# Patient Record
Sex: Female | Born: 2000 | Hispanic: No | Marital: Single | Smoking: Never smoker
Health system: Southern US, Community
[De-identification: ages and names within clinical notes are randomized; demographics above are authoritative.]

## PROBLEM LIST (undated history)

## (undated) DIAGNOSIS — F419 Anxiety disorder, unspecified: Secondary | ICD-10-CM

## (undated) DIAGNOSIS — R609 Edema, unspecified: Secondary | ICD-10-CM

## (undated) DIAGNOSIS — K5909 Other constipation: Secondary | ICD-10-CM

## (undated) DIAGNOSIS — E119 Type 2 diabetes mellitus without complications: Secondary | ICD-10-CM

## (undated) DIAGNOSIS — R7303 Prediabetes: Secondary | ICD-10-CM

## (undated) HISTORY — DX: Anxiety disorder, unspecified: F41.9

## (undated) HISTORY — DX: Prediabetes: R73.03

## (undated) HISTORY — DX: Edema, unspecified: R60.9

## (undated) HISTORY — DX: Other constipation: K59.09

---
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
## 2021-01-12 ENCOUNTER — Other Ambulatory Visit: Payer: Self-pay | Admitting: Adult Health

## 2021-01-15 ENCOUNTER — Emergency Department (HOSPITAL_COMMUNITY)
Admission: EM | Admit: 2021-01-15 | Discharge: 2021-01-15 | Disposition: A | Discharge status: Home / Self Care | Payer: BC Managed Care – PPO | Attending: Emergency Medicine | Admitting: Emergency Medicine

## 2021-01-15 ENCOUNTER — Other Ambulatory Visit: Payer: Self-pay

## 2021-01-15 ENCOUNTER — Emergency Department (HOSPITAL_COMMUNITY): Payer: BC Managed Care – PPO

## 2021-01-15 DIAGNOSIS — N939 Abnormal uterine and vaginal bleeding, unspecified: Secondary | ICD-10-CM | POA: Insufficient documentation

## 2021-01-15 DIAGNOSIS — R109 Unspecified abdominal pain: Secondary | ICD-10-CM | POA: Diagnosis not present

## 2021-01-15 LAB — URINALYSIS, ROUTINE W REFLEX MICROSCOPIC
Bacteria, UA: NONE SEEN
Bilirubin Urine: NEGATIVE
Glucose, UA: NEGATIVE mg/dL
Ketones, ur: NEGATIVE mg/dL
Leukocytes,Ua: NEGATIVE
Nitrite: NEGATIVE
Protein, ur: 30 mg/dL — AB
RBC / HPF: >50 RBC/hpf — ABNORMAL HIGH (ref 0–5)
Specific Gravity, Urine: 1.029 (ref 1.005–1.030)
pH: 5 (ref 5.0–8.0)

## 2021-01-15 LAB — POC URINE PREG, ED: Preg Test, Ur: NEGATIVE

## 2021-01-15 MED ORDER — KETOROLAC TROMETHAMINE 30 MG/ML IJ SOLN
60.0000 mg | Freq: Once | INTRAMUSCULAR | Status: AC
  Administered 2021-01-15: 60 mg via INTRAMUSCULAR
  Filled 2021-01-15: qty 2

## 2021-01-15 NOTE — ED Provider Notes (Signed)
[facility] EMERGENCY DEPARTMENT Provider Note   CSN: [id number] Arrival date & time: 01/15/21  0301     History Chief Complaint  Patient presents with  . Flank Pain    Angie Jackson is a 20 y.o. female.  The history is provided by the patient.  Flank Pain This is a new problem. The current episode started 2 days ago. The problem occurs constantly. The problem has been gradually worsening. Pertinent negatives include no chest pain and no abdominal pain. Nothing aggravates the symptoms. Nothing relieves the symptoms.  Patient presents for 2 complaints.  Patient reports she has been having right-sided flank pain for the past 2 days.  No trauma.  It does not wrap around into her abdomen.  No chest pain or shortness of breath.  No dysuria or urinary frequency  Patient also reports vaginal bleeding.  She reports her menstrual cycle completed last week.  Then this bleeding started over the past day.  She reports initially it was spotting, now she is having heavier flow.  She has only used 1 pad since that time. She does not take any contraceptives.  She is not on anticoagulation      PMH-none Patient Active Problem List   Diagnosis Date Noted  . 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   OB History    Gravida  0   Para  0   Term  0   Preterm  0   AB  0   Living  0     SAB  0   IAB  0   Ectopic  0   Multiple  0   Live Births  0           No family history on file.  Social History   Tobacco Use  . Smoking status: Never Smoker  . Smokeless tobacco: Never Used  Vaping Use  . Vaping Use: Never used  Substance Use Topics  . Alcohol use: Never  . Drug use: Never    Home Medications Prior to Admission medications   Medication Sig Start Date End Date Taking? Authorizing Provider  medroxyPROGESTERone Acetate 150 MG/ML SUSY Dispense 150mg  to be administered at office 10/09/20 01/15/21  01/17/21, NP    Allergies     Patient has no known allergies.  Review of Systems   Review of Systems  Constitutional: Negative for fever.  Cardiovascular: Negative for chest pain.  Gastrointestinal: Negative for abdominal pain and vomiting.  Genitourinary: Positive for flank pain and vaginal bleeding. Negative for dysuria and frequency.  All other systems reviewed and are negative.   Physical Exam Updated Vital Signs BP 101/78 (BP Location: Left Arm)   Pulse 63   Temp 98.4 F (36.9 C)   Resp 17   Ht 1.549 m (5\' 1" )   Wt 54.4 kg   LMP  (Within Weeks)   SpO2 98%   BMI 22.67 kg/m   Physical Exam  CONSTITUTIONAL: Well developed/well nourished, mildly uncomfortable appearing HEAD: Normocephalic/atraumatic EYES: EOMI/PERRL ENMT: Mucous membranes moist NECK: supple no meningeal signs SPINE/BACK:entire spine nontender, no bruising/crepitance/stepoffs noted to spine CV: S1/S2 noted, no murmurs/rubs/gallops noted LUNGS: Lungs are clear to auscultation bilaterally, no apparent distress ABDOMEN: soft, nontender, no rebound or guarding, bowel sounds noted throughout abdomen GU: Right cva tenderness, no rash or erythema to flank NEURO: Pt is awake/alert/appropriate, moves all extremitiesx4.  No facial droop.   EXTREMITIES: pulses normal/equal, full ROM SKIN: warm, color normal  PSYCH: no abnormalities of mood noted, alert and oriented to situation  ED Results / Procedures / Treatments   Labs (all labs ordered are listed, but only abnormal results are displayed) Labs Reviewed  URINALYSIS, ROUTINE W REFLEX MICROSCOPIC - Abnormal; Notable for the following components:      Result Value   APPearance CLOUDY (*)    Hgb urine dipstick LARGE (*)    Protein, ur 30 (*)    RBC / HPF >50 (*)    All other components within normal limits  URINE CULTURE  POC URINE PREG, ED    EKG None  Radiology CT Renal Stone Study  Result Date: 01/15/2021 CLINICAL DATA:  Right flank pain, vaginal bleeding EXAM: CT ABDOMEN AND  PELVIS WITHOUT CONTRAST TECHNIQUE: Multidetector CT imaging of the abdomen and pelvis was performed following the standard protocol without IV contrast. COMPARISON:  02/23/2020 FINDINGS: Lower chest: No acute abnormality. Hepatobiliary: No focal liver abnormality is seen. No gallstones, gallbladder wall thickening, or biliary dilatation. Pancreas: Unremarkable Spleen: Unremarkable Adrenals/Urinary Tract: Adrenal glands are unremarkable. Kidneys are normal, without renal calculi, focal lesion, or hydronephrosis. Bladder is unremarkable. Stomach/Bowel: Stomach is within normal limits. Appendix appears normal. No evidence of bowel wall thickening, distention, or inflammatory changes. No free intraperitoneal gas or fluid. Vascular/Lymphatic: No significant vascular findings are present. No enlarged abdominal or pelvic lymph nodes. Reproductive: Uterus and bilateral adnexa are unremarkable. Other: No abdominal wall hernia.  Rectum unremarkable. Musculoskeletal: No acute bone abnormality. No lytic or blastic bone lesion. IMPRESSION: No acute intra-abdominal pathology identified. No radiographic explanation for the patient's reported symptoms. Electronically Signed   By: Helyn Numbers MD   On: 01/15/2021 05:33    Procedures Procedures   Medications Ordered in ED Medications  ketorolac (TORADOL) 30 MG/ML injection 60 mg (60 mg Intramuscular Given 01/15/21 0518)    ED Course  I have reviewed the triage vital signs and the nursing notes.  Pertinent labs & imaging results that were available during my care of the patient were reviewed by me and considered in my medical decision making (see chart for details).    MDM Rules/Calculators/A&P                          Patient presented for concerns of vaginal bleeding and flank pain.  Due to the bleeding, it is unclear if the hematuria is from a kidney stone versus contaminant.  She denied any urinary symptoms to suggest UTI (we will send urine culture) Patient  appeared uncomfortable with her flank pain.  After further discussion, we agreed to proceed with CT imaging to rule out ureteral stone. CT renal study shows no acute findings Patient is otherwise well-appearing.  Will defer pelvic exam at this time.  Will refer to gynecology  Final Clinical Impression(s) / ED Diagnoses Final diagnoses:  Flank pain  Vaginal bleeding    Rx / DC Orders ED Discharge Orders    None       Zadie Rhine, MD 01/15/21 [phone]

## 2021-01-15 NOTE — ED Notes (Signed)
Patient transported to CT 

## 2021-01-15 NOTE — ED Triage Notes (Signed)
Pt c/o of flank pain and vaginal bleeding. Pt states she had her period last week. Pt states her cycle is irregular.

## 2021-01-17 LAB — URINE CULTURE: Culture: >=100000 — AB

## 2021-01-18 ENCOUNTER — Telehealth: Payer: Self-pay | Admitting: Emergency Medicine

## 2021-01-18 DIAGNOSIS — L700 Acne vulgaris: Secondary | ICD-10-CM | POA: Diagnosis not present

## 2021-01-18 NOTE — Telephone Encounter (Signed)
Post ED Visit - Positive Culture Follow-up  Culture report reviewed by antimicrobial stewardship pharmacist: [facility] []  , Pharm.D. []  Enzo Bi, Pharm.D., BCPS AQ-ID []  , Pharm.D., BCPS []  Celedonio Miyamoto, Pharm.D., BCPS []  [place], Garvin Fila.D., BCPS, AAHIVP []  , Pharm.D., BCPS, AAHIVP []  Georgina Pillion, PharmD, BCPS []  , PharmD, BCPS []  [place], PharmD, BCPS []  [address], PharmD []  , PharmD, BCPS []  Estella Husk, PharmD  Pharmacy Team []  Lysle Pearl, PharmD []  , PharmD []  Phillips Climes, PharmD []  , Rph []  Agapito Games) , PharmD []  Verlan Friends, PharmD []  , PharmD []  Mervyn Gay, PharmD []  , PharmD []  Vinnie Level, PharmD []  [facility], PharmD []  , PharmD []  Len Childs, PharmD   Positive urine culture Treated with none, likely contamination and no further patient follow-up is required at this time.  01/18/2021, 10:10 AM

## 2021-09-06 ENCOUNTER — Ambulatory Visit: Payer: BC Managed Care – PPO | Admitting: Women's Health

## 2021-10-30 IMAGING — CT CT RENAL STONE PROTOCOL
2 of 4 series · 16 of 46 positions shown, 18 images · non-contrast
Comparison: 02/23/2020

CLINICAL DATA: Right flank pain, vaginal bleeding

EXAM:
CT ABDOMEN AND PELVIS WITHOUT CONTRAST
TECHNIQUE: Multidetector CT imaging of the abdomen and pelvis was performed
following the standard protocol without IV contrast.

[Series 2: axial st · axial · 0.53mm/px · z∈[+888,+1228]mm · 13 of 78 slices shown, 15 images]
[im 5/78  soft-tissue]
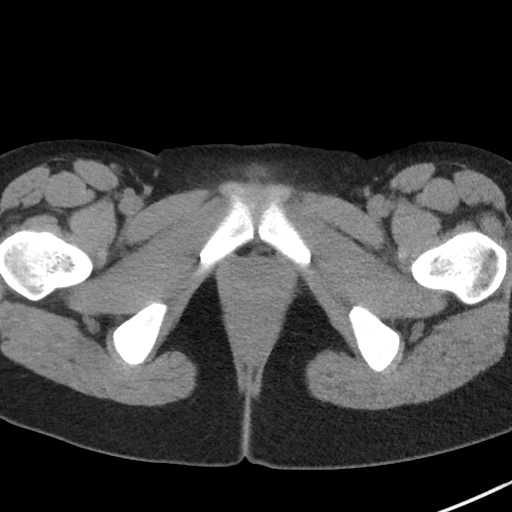
[im 5/78  bone]
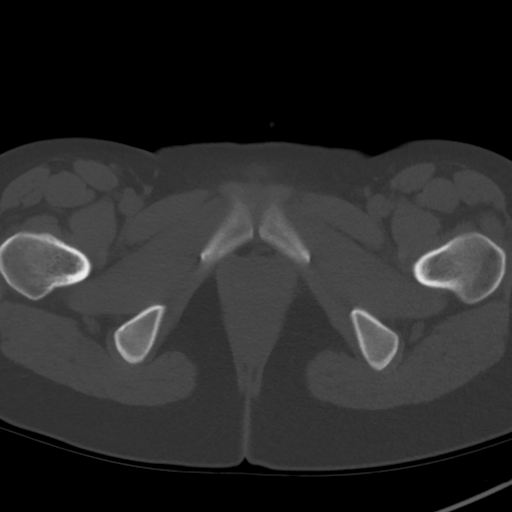
[im 10/78  soft-tissue]
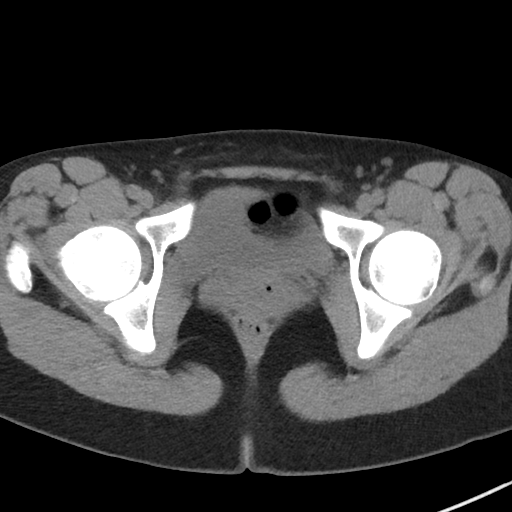
[im 15/78  soft-tissue]
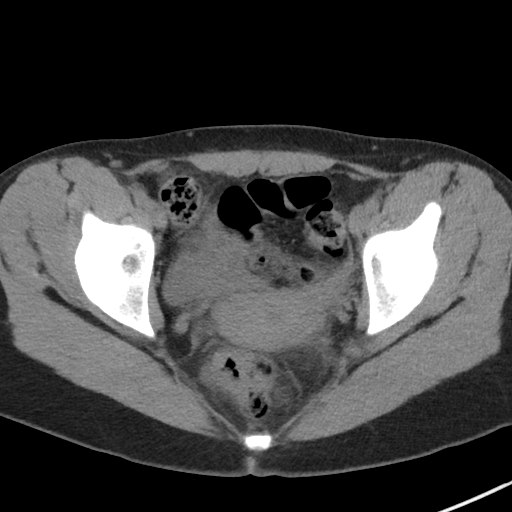
[im 25/78  soft-tissue]
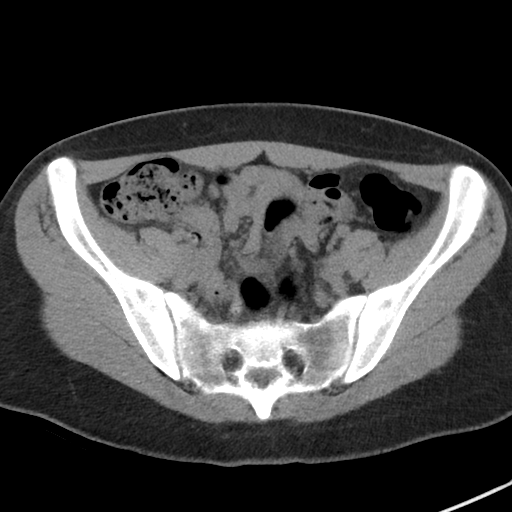
[im 29/78  soft-tissue]
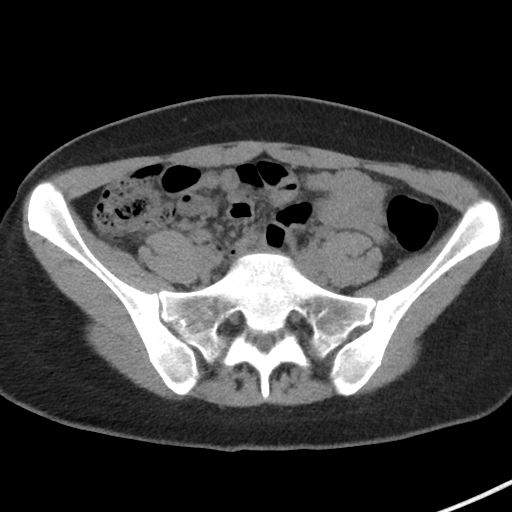
[im 34/78  soft-tissue]
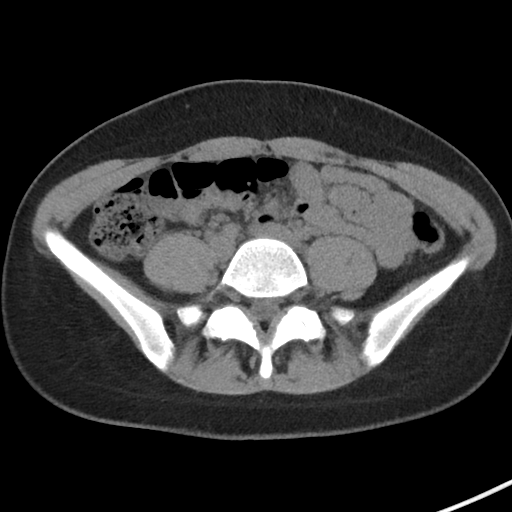
[im 39/78  soft-tissue]
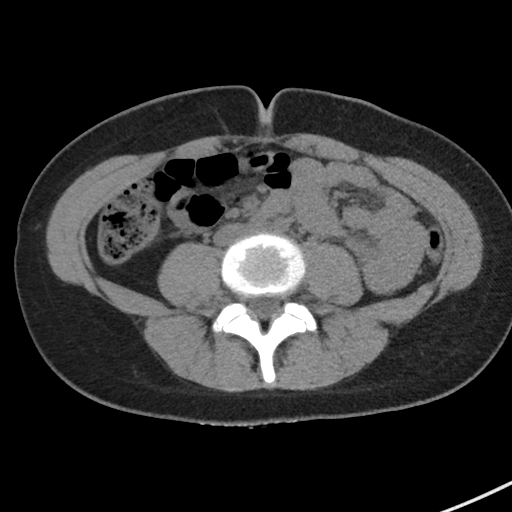
[im 44/78  soft-tissue]
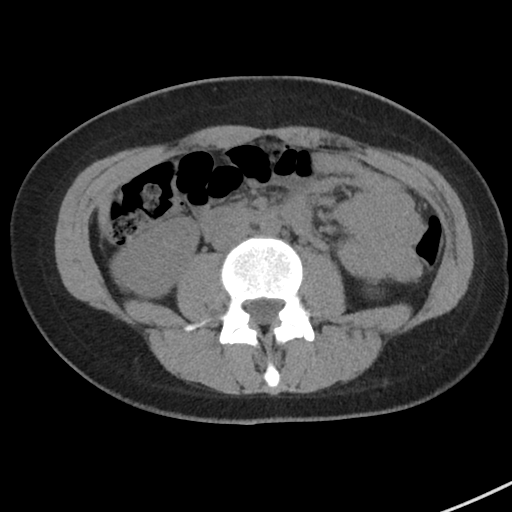
[im 49/78  soft-tissue]
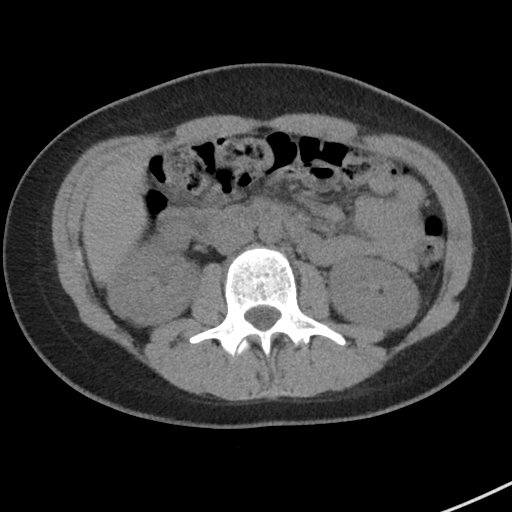
[im 49/78  bone]
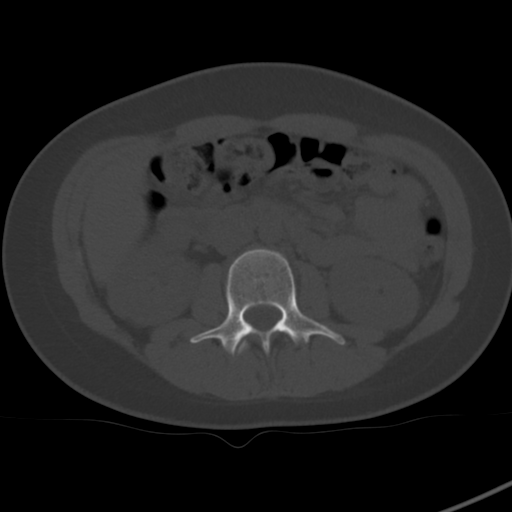
[im 53/78  soft-tissue]
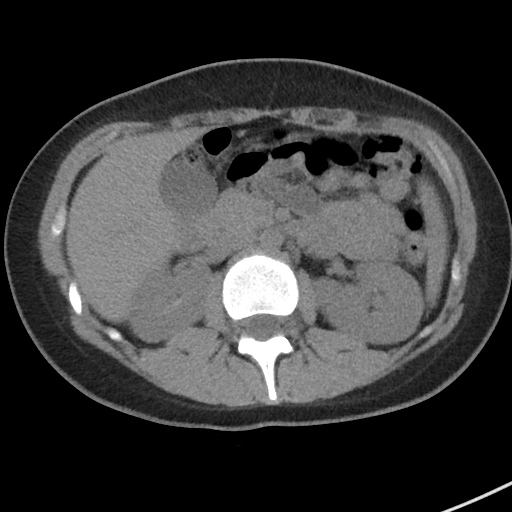
[im 63/78  soft-tissue]
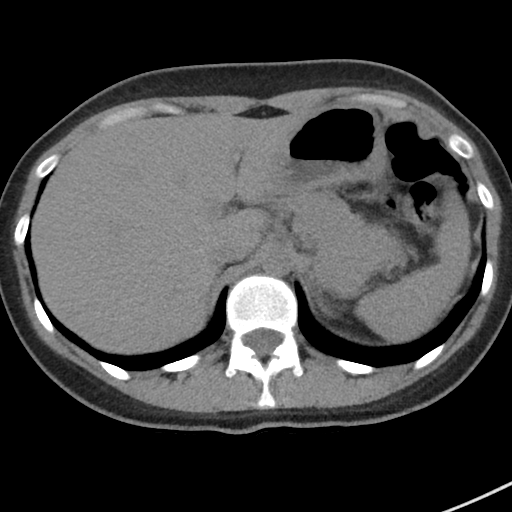
[im 68/78  soft-tissue]
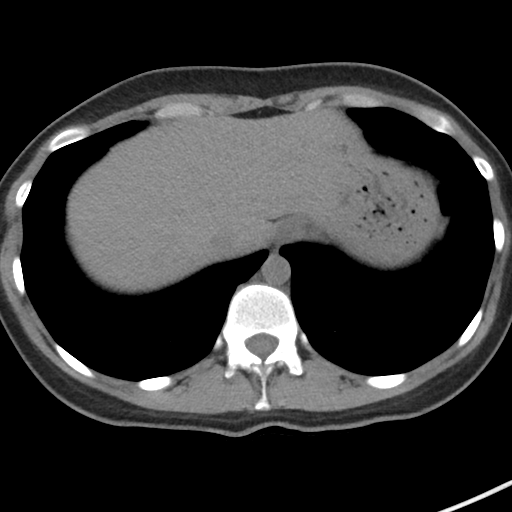
[im 73/78  soft-tissue]
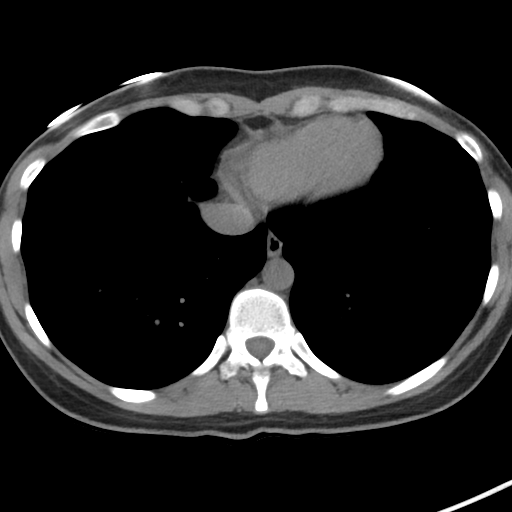

[Series 5: coronal st · coronal · 0.62mm/px · 3 of 60 slices shown]
[im 20/60  soft-tissue]
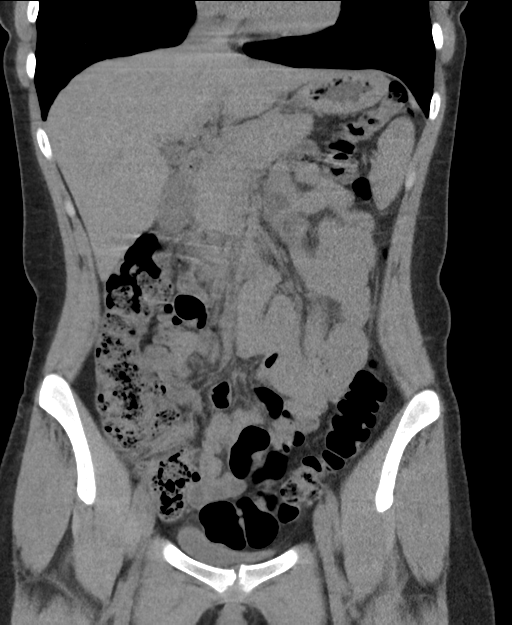
[im 27/60  soft-tissue]
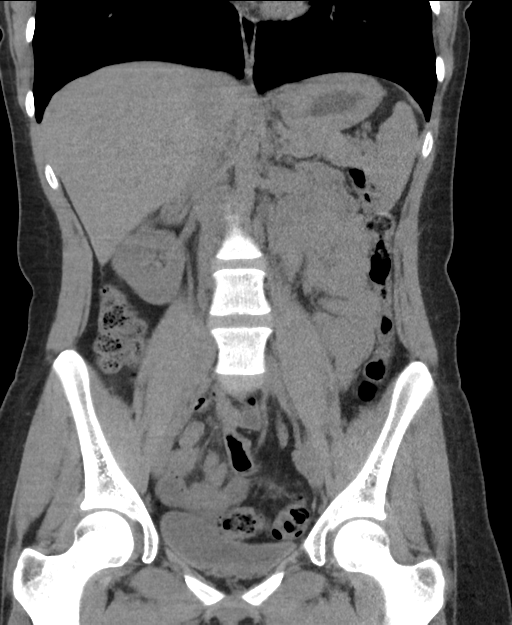
[im 33/60  soft-tissue]
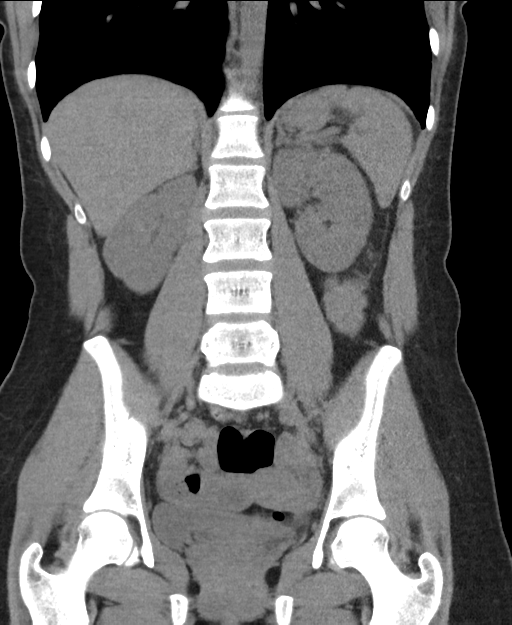

[16 of 46 positions shown; findings below may reference images not displayed]

FINDINGS: Lower chest: No acute abnormality.

Hepatobiliary: No focal liver abnormality is seen. No gallstones,
gallbladder wall thickening, or biliary dilatation.

Pancreas: Unremarkable

Spleen: Unremarkable

Adrenals/Urinary Tract: Adrenal glands are unremarkable. Kidneys are
normal, without renal calculi, focal lesion, or hydronephrosis.
Bladder is unremarkable.

Stomach/Bowel: Stomach is within normal limits. Appendix appears
normal. No evidence of bowel wall thickening, distention, or
inflammatory changes. No free intraperitoneal gas or fluid.

Vascular/Lymphatic: No significant vascular findings are present. No
enlarged abdominal or pelvic lymph nodes.

Reproductive: Uterus and bilateral adnexa are unremarkable.

Other: No abdominal wall hernia.  Rectum unremarkable.

Musculoskeletal: No acute bone abnormality. No lytic or blastic bone
lesion.
IMPRESSION: No acute intra-abdominal pathology identified. No radiographic
explanation for the patient's reported symptoms.

## 2022-01-10 ENCOUNTER — Encounter: Payer: Self-pay | Admitting: Women's Health

## 2022-01-10 ENCOUNTER — Ambulatory Visit (INDEPENDENT_AMBULATORY_CARE_PROVIDER_SITE_OTHER): Payer: BC Managed Care – PPO | Admitting: Women's Health

## 2022-01-10 ENCOUNTER — Other Ambulatory Visit (HOSPITAL_COMMUNITY)
Admission: RE | Admit: 2022-01-10 | Discharge: 2022-01-10 | Disposition: A | Discharge status: Home / Self Care | Payer: BC Managed Care – PPO | Source: Ambulatory Visit | Attending: Women's Health | Admitting: Women's Health

## 2022-01-10 VITALS — BP 112/77 | HR 75 | Ht 61.0 in | Wt 122.0 lb

## 2022-01-10 DIAGNOSIS — N926 Irregular menstruation, unspecified: Secondary | ICD-10-CM

## 2022-01-10 DIAGNOSIS — Z113 Encounter for screening for infections with a predominantly sexual mode of transmission: Secondary | ICD-10-CM | POA: Insufficient documentation

## 2022-01-10 DIAGNOSIS — Z3202 Encounter for pregnancy test, result negative: Secondary | ICD-10-CM | POA: Diagnosis not present

## 2022-01-10 DIAGNOSIS — R102 Pelvic and perineal pain: Secondary | ICD-10-CM | POA: Insufficient documentation

## 2022-01-10 DIAGNOSIS — N941 Unspecified dyspareunia: Secondary | ICD-10-CM | POA: Diagnosis not present

## 2022-01-10 DIAGNOSIS — Z789 Other specified health status: Secondary | ICD-10-CM

## 2022-01-10 LAB — POCT URINE PREGNANCY: Preg Test, Ur: NEGATIVE

## 2022-01-10 MED ORDER — NEXTSTELLIS 3-14.2 MG PO TABS: 1.0000 | ORAL_TABLET | Freq: Every day | ORAL | 3 refills | Status: DC

## 2022-01-10 NOTE — Progress Notes (Signed)
? ?GYN VISIT ?Patient name: Angie Jackson MRN [id number]  Date of birth: 12-Sep-2000 ?Chief Complaint:   ?Pelvic Pain (Mostly right side but feels it on both sides. ) ? ?History of Present Illness:   ?FALISA LAMORA is a 21 y.o. G0P0000 Hispanic female being seen today for sharp pelvic pain x 1-74yrs, worse for last , Rt >Lt, hurts w/ and w/o sex. Irregular periods,  q 1-28mths, last 5d, heavy to begin with- changes pad q 2-3hrs, bad cramps.  Primary amenorrhea at 21yo, had to start period w/ provera, then started on sprintec. Pt stopped b/c periods were too heavy. Switched to depo, had very bad acne w/ depo, only got 1 dose. Has not been on anything since. Has been using condoms. Does not want a pregnancy right now. Denies hirsutism, hair loss, acne, acanthosis nigricans.  ?No LMP recorded. ?The current method of family planning is condoms.  ?Last pap <21yo. Results were: N/A ? ? ?  04/04/2018  ?  3:45 PM  ?Depression screen PHQ 2/9  ?Decreased Interest 0  ?Down, Depressed, Hopeless 0  ?PHQ - 2 Score 0  ? ?  ?   ? View : No data to display.  ?  ?  ?  ? ? ? ?Review of Systems:   ?Pertinent items are noted in HPI ?Denies fever/chills, dizziness, headaches, visual disturbances, fatigue, shortness of breath, chest pain, abdominal pain, vomiting, abnormal vaginal discharge/itching/odor/irritation, problems with periods, bowel movements, urination, or intercourse unless otherwise stated above.  ?Pertinent History Reviewed:  ?Reviewed past medical,surgical, social, obstetrical and family history.  ?Reviewed problem list, medications and allergies. ?Physical Assessment:  ? ?Vitals:  ? 01/10/22 1549  ?BP: 112/77  ?Pulse: 75  ?Weight: 122 lb (55.3 kg)  ?Height: 5\' 1"  (1.549 m)  ?Body mass index is 23.05 kg/m?. ? ?     Physical Examination:  ? General appearance: alert, well appearing, and in no distress ? Mental status: alert, oriented to person, place, and time ? Skin: warm & dry  ? Cardiovascular: normal heart rate  noted ? Respiratory: normal respiratory effort, no distress ? Abdomen: soft, non-tender  ? Pelvic: VULVA: normal appearing vulva with no masses, tenderness or lesions, VAGINA: normal appearing vagina with normal color and discharge, no lesions, CERVIX: normal appearing cervix without discharge or lesions, UTERUS: uterus is normal size, shape, consistency and slightly tender, ADNEXA: normal adnexa in size, bilateral tenderness, Rt >Lt, no masses ? Extremities: no edema  ? ?Chaperone:   ? ?Results for orders placed or performed in visit on 01/10/22 (from the past 24 hour(s))  ?POCT urine pregnancy  ? Collection Time: 01/10/22  3:58 PM  ?Result Value Ref Range  ? Preg Test, Ur Negative Negative  ?  ?Assessment & Plan:  ?1) Pelvic pain, dyspareunia, irregular periods> CV swab, will get pelvic u/s, start on Nexstellis (rx to [facility] and gave 3 sample packs) ? ?Meds:  ?Meds ordered this encounter  ?Medications  ? Drospirenone-Estetrol (NEXTSTELLIS) 3-14.2 MG TABS  ?  Sig: Take 1 tablet by mouth daily.  ?  Dispense:  90 tablet  ?  Refill:  3  ?  Order Specific Question:   Supervising Provider  ?  Answer:   01/12/22 H [[id number]]  ? ? ?Orders Placed This Encounter  ?Procedures  ? [facility] PELVIS (TRANSABDOMINAL ONLY)  ? US PELVIS TRANSVAGINAL NON-OB (TV ONLY)  ? POCT urine pregnancy  ? ? ?Return for 1st available, US:GYN and f/u after. ? ?[place]  Cedric Fishman, WHNP-BC ?01/10/2022 ?4:26 PM  ?

## 2022-01-12 LAB — CERVICOVAGINAL ANCILLARY ONLY
Bacterial Vaginitis (gardnerella): POSITIVE — AB
Candida Glabrata: NEGATIVE
Candida Vaginitis: NEGATIVE
Chlamydia: NEGATIVE
Comment: NEGATIVE
Comment: NEGATIVE
Comment: NEGATIVE
Comment: NEGATIVE
Comment: NEGATIVE
Comment: NORMAL
Neisseria Gonorrhea: NEGATIVE
Trichomonas: NEGATIVE

## 2022-01-14 MED ORDER — METRONIDAZOLE 500 MG PO TABS: 500.0000 mg | ORAL_TABLET | Freq: Two times a day (BID) | ORAL | 0 refills | Status: DC

## 2022-01-14 NOTE — Addendum Note (Signed)
Addended by: Shawna Clamp R on: 01/14/2022 11:53 PM   Modules accepted: Orders

## 2022-02-22 ENCOUNTER — Ambulatory Visit (INDEPENDENT_AMBULATORY_CARE_PROVIDER_SITE_OTHER): Payer: BC Managed Care – PPO | Admitting: Women's Health

## 2022-02-22 ENCOUNTER — Ambulatory Visit (INDEPENDENT_AMBULATORY_CARE_PROVIDER_SITE_OTHER): Payer: BC Managed Care – PPO

## 2022-02-22 ENCOUNTER — Encounter: Payer: Self-pay | Admitting: Women's Health

## 2022-02-22 VITALS — BP 109/72 | HR 75 | Ht 60.0 in | Wt 121.2 lb

## 2022-02-22 DIAGNOSIS — N9489 Other specified conditions associated with female genital organs and menstrual cycle: Secondary | ICD-10-CM | POA: Diagnosis not present

## 2022-02-22 DIAGNOSIS — E282 Polycystic ovarian syndrome: Secondary | ICD-10-CM | POA: Diagnosis not present

## 2022-02-22 DIAGNOSIS — N926 Irregular menstruation, unspecified: Secondary | ICD-10-CM | POA: Diagnosis not present

## 2022-02-22 DIAGNOSIS — R102 Pelvic and perineal pain: Secondary | ICD-10-CM | POA: Diagnosis not present

## 2022-02-22 DIAGNOSIS — N941 Unspecified dyspareunia: Secondary | ICD-10-CM

## 2022-02-22 NOTE — Progress Notes (Signed)
GYN VISIT Patient name: Angie Jackson MRN [id number]  Date of birth: 02/08/2001 Chief Complaint:   GYN Ultrasound and Follow-up (Pelvic pain)  History of Present Illness:   Angie Jackson is a 21 y.o. G0P0000 Hispanic female being seen today for f/u on nextstellis rx'd on 5/15 for pelvic pain and irregular periods q 1-32mths. Doing well on COCs, did notice swelling in Rt leg one day at work last week, has since resolved, no pain/redness. Burning at introitus x 2wks. +Bv on CV swab 5/15, took metronidazole. Uses [employer], but always has. Denies abnormal discharge, itching/odor/irritation.   Patient's last menstrual period was 02/10/2022. The current method of family planning is OCP (estrogen/progesterone).  Last pap <21yo. Results were: N/A     04/04/2018    3:45 PM  Depression screen PHQ 2/9  Decreased Interest 0  Down, Depressed, Hopeless 0  PHQ - 2 Score 0         No data to display           Review of Systems:   Pertinent items are noted in HPI Denies fever/chills, dizziness, headaches, visual disturbances, fatigue, shortness of breath, chest pain, abdominal pain, vomiting, abnormal vaginal discharge/itching/odor/irritation, problems with periods, bowel movements, urination, or intercourse unless otherwise stated above.  Pertinent History Reviewed:  Reviewed past medical,surgical, social, obstetrical and family history.  Reviewed problem list, medications and allergies. Physical Assessment:   Vitals:   02/22/22 1542  BP: 109/72  Pulse: 75  Weight: 121 lb 3.2 oz (55 kg)  Height: 5' (1.524 m)  Body mass index is 23.67 kg/m.       Physical Examination:   General appearance: alert, well appearing, and in no distress  Mental status: alert, oriented to person, place, and time  Skin: warm & dry   Cardiovascular: normal heart rate noted  Respiratory: normal respiratory effort, no distress  Abdomen: soft, non-tender   Pelvic: normal external genitalia, no  lesions, erythema, abnormal d/c  Extremities: no edema   Chaperone: Jobe Marker    Today's pelvic u/s:  Angie Jackson is a 21 y.o. G0P0000 LMP 02/10/2022 She is here for a pelvic sonogram for pelvic pain,dyspareunia and irregular periods.   Uterus                      4.2 x 3.7 x 2.3 cm, Total uterine volume 20 cc, homogeneous anteflexed uterus,WNL   Endometrium          8 mm, symmetrical, wnl   Right ovary             4.1 x 3.1 x 2.6 cm, vol 17.6 cc,enlarged ovary with multiple peripheral follicles    Left ovary                2 x 3.6 x 2.4 cm, vol 18.7 cc,enlarged ovary with multiple peripheral follicles    No free fluid    Technician Comments:   PELVIC US TA/TV: homogeneous anteflexed uterus,WNL,EEC 8 mm,multiple small simple nabothian cysts,bilaterally enlarged ovaries with multiple small peripheral follicles    Chaperone [address] Flora Lipps 02/22/2022 3:47 PM      No results found for this or any previous visit (from the past 24 hour(s)).  Assessment & Plan:  1) PCOS> dx by u/s and oligomenorrhea, no hyperadrogenic sx, doing well on nextstellis- continue, f/u from start date  2) Burning at vaginal introitus> normal exam, CV swab not done, was +BV  on 5/15, finished tx. Try changing soaps/use no soap, let us know if worsening/not improving  Meds: No orders of the defined types were placed in this encounter.   No orders of the defined types were placed in this encounter.   Return in about 7 weeks (around 04/12/2022) for med f/u, in person.  Cheral Marker CNM, [facility] 02/22/2022 4:24 PM

## 2022-03-07 ENCOUNTER — Encounter: Payer: Self-pay | Admitting: Women's Health

## 2022-03-07 ENCOUNTER — Telehealth: Payer: Self-pay

## 2022-03-07 ENCOUNTER — Other Ambulatory Visit: Payer: Self-pay | Admitting: Women's Health

## 2022-03-07 DIAGNOSIS — M7989 Other specified soft tissue disorders: Secondary | ICD-10-CM

## 2022-03-07 DIAGNOSIS — M79604 Pain in right leg: Secondary | ICD-10-CM

## 2022-03-07 NOTE — Telephone Encounter (Signed)
Pt would like for Joellyn Haff to give her a call, she has a few questions to ask her.  She goes on break again at 11:30.

## 2022-03-07 NOTE — Telephone Encounter (Signed)
Responded to patient via mychart

## 2022-03-08 ENCOUNTER — Ambulatory Visit (HOSPITAL_COMMUNITY)
Admission: RE | Admit: 2022-03-08 | Discharge: 2022-03-08 | Disposition: A | Discharge status: Home / Self Care | Payer: BC Managed Care – PPO | Source: Ambulatory Visit | Attending: Women's Health | Admitting: Women's Health

## 2022-03-08 ENCOUNTER — Encounter: Payer: Self-pay | Admitting: Women's Health

## 2022-03-08 DIAGNOSIS — M79604 Pain in right leg: Secondary | ICD-10-CM | POA: Diagnosis not present

## 2022-03-08 DIAGNOSIS — M7989 Other specified soft tissue disorders: Secondary | ICD-10-CM | POA: Insufficient documentation

## 2022-03-08 DIAGNOSIS — M79661 Pain in right lower leg: Secondary | ICD-10-CM | POA: Diagnosis not present

## 2022-03-21 ENCOUNTER — Encounter: Payer: Self-pay | Admitting: Women's Health

## 2022-03-22 ENCOUNTER — Other Ambulatory Visit: Payer: Self-pay | Admitting: Women's Health

## 2022-03-22 MED ORDER — LO LOESTRIN FE 1 MG-10 MCG / 10 MCG PO TABS: 1.0000 | ORAL_TABLET | Freq: Every day | ORAL | 3 refills | Status: DC

## 2022-04-12 ENCOUNTER — Ambulatory Visit (INDEPENDENT_AMBULATORY_CARE_PROVIDER_SITE_OTHER): Payer: BC Managed Care – PPO | Admitting: Women's Health

## 2022-04-12 ENCOUNTER — Encounter: Payer: Self-pay | Admitting: Women's Health

## 2022-04-12 VITALS — BP 109/74 | HR 80 | Ht 60.0 in | Wt 125.0 lb

## 2022-04-12 DIAGNOSIS — E282 Polycystic ovarian syndrome: Secondary | ICD-10-CM | POA: Diagnosis not present

## 2022-04-12 NOTE — Progress Notes (Signed)
   GYN VISIT Patient name: Angie Jackson MRN [id number]  Date of birth: [birth year] Chief Complaint:   Follow-up  History of Present Illness:   Angie Jackson is a 21 y.o. G0P0000 Hispanic female being seen today for f/u on COCs rx'd 5/15 for PCOS. Initially rx'd nextstellis, was causing Rt lower leg edema, had neg DVT u/s. Switched to LoLo, no swelling since, just started period yesterday. No problems.     Patient's last menstrual period was 04/11/2022. The current method of family planning is OCP (estrogen/progesterone).  Last pap <21yo. Results were: N/A     04/04/2018    3:45 PM  Depression screen PHQ 2/9  Decreased Interest 0  Down, Depressed, Hopeless 0  PHQ - 2 Score 0         No data to display           Review of Systems:   Pertinent items are noted in HPI Denies fever/chills, dizziness, headaches, visual disturbances, fatigue, shortness of breath, chest pain, abdominal pain, vomiting, abnormal vaginal discharge/itching/odor/irritation, problems with periods, bowel movements, urination, or intercourse unless otherwise stated above.  Pertinent History Reviewed:  Reviewed past medical,surgical, social, obstetrical and family history.  Reviewed problem list, medications and allergies. Physical Assessment:   Vitals:   04/12/22 1611  BP: 109/74  Pulse: 80  Weight: 125 lb (56.7 kg)  Height: 5' (1.524 m)  Body mass index is 24.41 kg/m.       Physical Examination:   General appearance: alert, well appearing, and in no distress  Mental status: alert, oriented to person, place, and time  Skin: warm & dry   Cardiovascular: normal heart rate noted  Respiratory: normal respiratory effort, no distress  Abdomen: soft, non-tender   Pelvic: examination not indicated  Extremities: no edema   Chaperone: N/A    No results found for this or any previous visit (from the past 24 hour(s)).  Assessment & Plan:  1) PCOS> doing well on LoLoestrin, f/u after 6/2 for pap  Meds:  No orders of the defined types were placed in this encounter.   No orders of the defined types were placed in this encounter.   Return for 01/29/23 for pap & physical.  Cheral Marker CNM, [facility] 04/12/2022 4:35 PM

## 2022-04-28 ENCOUNTER — Ambulatory Visit
Admission: EM | Admit: 2022-04-28 | Discharge: 2022-04-28 | Discharge status: Against Medical Advice | Payer: BC Managed Care – PPO

## 2022-04-28 ENCOUNTER — Encounter (HOSPITAL_COMMUNITY): Payer: Self-pay

## 2022-04-28 ENCOUNTER — Emergency Department (HOSPITAL_COMMUNITY)
Admission: EM | Admit: 2022-04-28 | Discharge: 2022-04-28 | Disposition: A | Discharge status: Home / Self Care | Payer: BC Managed Care – PPO | Attending: Emergency Medicine | Admitting: Emergency Medicine

## 2022-04-28 ENCOUNTER — Other Ambulatory Visit: Payer: Self-pay

## 2022-04-28 DIAGNOSIS — U071 COVID-19: Secondary | ICD-10-CM | POA: Diagnosis not present

## 2022-04-28 DIAGNOSIS — J111 Influenza due to unidentified influenza virus with other respiratory manifestations: Secondary | ICD-10-CM | POA: Diagnosis not present

## 2022-04-28 DIAGNOSIS — R6889 Other general symptoms and signs: Secondary | ICD-10-CM

## 2022-04-28 DIAGNOSIS — M791 Myalgia, unspecified site: Secondary | ICD-10-CM | POA: Diagnosis not present

## 2022-04-28 LAB — RESP PANEL BY RT-PCR (FLU A&B, COVID) ARPGX2
Influenza A by PCR: NEGATIVE
Influenza B by PCR: NEGATIVE
SARS Coronavirus 2 by RT PCR: POSITIVE — AB

## 2022-04-28 NOTE — ED Triage Notes (Signed)
Pt presents to ED with complaints of generalized body aches, headache and cough started today

## 2022-04-28 NOTE — ED Notes (Signed)
Pt not found in waiting room.

## 2022-04-28 NOTE — Discharge Instructions (Addendum)
Please use Tylenol or ibuprofen for fever, bodyaches.  You may use 600 mg ibuprofen every 6 hours or 1000 mg of Tylenol every 6 hours.  You may choose to alternate between the 2.  This would be most effective.  Not to exceed 4 g of Tylenol within 24 hours.  Not to exceed 3200 mg ibuprofen 24 hours.  I recommend plenty of rest, plenty of fluids, over-the-counter cough and cold medication as needed.  You can check the results of your respiratory swab on your patient portal, otherwise I would treat this as another viral upper respiratory infection.  If you do have COVID, I recommend 5 days of strict quarantine, 5 days of strict masking thereafter, and monitoring for any worsening breathing.  If you have the flu or another unspecified viral illness then the treatment is just symptomatic care and time.  Refrain from going to work while you have an active fever.

## 2022-04-28 NOTE — ED Notes (Addendum)
Pt contact information called and no answer.

## 2022-04-28 NOTE — ED Provider Notes (Signed)
[facility] EMERGENCY DEPARTMENT Provider Note   CSN: [id number] Arrival date & time: 04/28/22  1717     History  Chief Complaint  Patient presents with   Generalized Body Aches    Angie Jackson is a 21 y.o. female with noncontributory past medical history who presents with generalized body aches, headache, cough that started today.  Patient denies any chest pain, shortness of breath.  She has not tried anything for the pain at this time.  Patient is wondering whether she could have COVID or the flu or something like that.  She has no history of asthma.  She denies feeling fever or chills but reports that she had mild temperature on arrival, on arrival 99.4 Fahrenheit.  Denies dysuria, diarrhea, loss of taste or smell.  HPI     Home Medications Prior to Admission medications   Medication Sig Start Date End Date Taking? Authorizing Provider  LO LOESTRIN FE 1 MG-10 MCG / 10 MCG tablet Take 1 tablet by mouth daily. 03/22/22   Cheral Marker, CNM  medroxyPROGESTERone Acetate 150 MG/ML SUSY Dispense 150mg  to be administered at office 10/09/20 01/15/21  01/17/21, NP      Allergies    Patient has no known allergies.    Review of Systems   Review of Systems  Respiratory:  Positive for cough.   Musculoskeletal:  Positive for myalgias.  All other systems reviewed and are negative.   Physical Exam Updated Vital Signs BP 124/86 (BP Location: Right Arm)   Pulse 89   Temp 99.4 F (37.4 C) (Oral)   Resp 20   Ht 5' (1.524 m)   Wt 55.3 kg   LMP 04/11/2022   SpO2 100%   BMI 23.83 kg/m  Physical Exam Vitals and nursing note reviewed.  Constitutional:      General: She is not in acute distress.    Appearance: Normal appearance.  HENT:     Head: Normocephalic and atraumatic.  Eyes:     General:        Right eye: No discharge.        Left eye: No discharge.  Cardiovascular:     Rate and Rhythm: Normal rate and regular rhythm.  Pulmonary:     Effort: Pulmonary  effort is normal. No respiratory distress.  Musculoskeletal:        General: No deformity.  Skin:    General: Skin is warm and dry.  Neurological:     General: No focal deficit present.     Mental Status: She is alert and oriented to person, place, and time.  Psychiatric:        Mood and Affect: Mood normal.        Behavior: Behavior normal.     ED Results / Procedures / Treatments   Labs (all labs ordered are listed, but only abnormal results are displayed) Labs Reviewed  RESP PANEL BY RT-PCR (FLU A&B, COVID) ARPGX2    EKG None  Radiology No results found.  Procedures Procedures    Medications Ordered in ED Medications - No data to display  ED Course/ Medical Decision Making/ A&P                           Medical Decision Making  This is a very well-appearing 21 year old female who presents with 1 day of body aches, headache, And dry cough.  She reports that she heard that COVID is going back around and  is worried that she may have COVID or flu.  Members differential diagnosis includes unspecified viral upper respiratory infection, COVID, flu, acute bronchitis, pneumonia, versus other upper respiratory infection.  On exam patient is without any vital sign abnormalities, 100% oxygen saturation on room air, normal respirations, she is not endorsing any chest pain, or shortness of breath.  Her temperature is mildly elevated at 99.4.  Given the duration of patient's symptoms I think it is Reasonable to assume that she has an upper respiratory infection, she is overall well-appearing and I would not recommend any additional treatment in the emergency department at this time.  RVP pending at this time, encourage patient to check results on patient portal and treat symptomatically.  Extensive return precautions given.  Patient discharged in stable condition.  Encouraged use ibuprofen, Tylenol for pain, body aches, headache.  She is neurologically intact throughout. Final  Clinical Impression(s) / ED Diagnoses Final diagnoses:  Flu-like symptoms    Rx / DC Orders ED Discharge Orders     None         Olene Floss, PA-C 04/28/22 1816    Pricilla Loveless, MD 04/29/22 [phone]

## 2022-07-11 DIAGNOSIS — L239 Allergic contact dermatitis, unspecified cause: Secondary | ICD-10-CM | POA: Diagnosis not present

## 2022-07-30 DIAGNOSIS — Z124 Encounter for screening for malignant neoplasm of cervix: Secondary | ICD-10-CM | POA: Diagnosis not present

## 2022-07-30 DIAGNOSIS — N739 Female pelvic inflammatory disease, unspecified: Secondary | ICD-10-CM | POA: Diagnosis not present

## 2022-07-30 DIAGNOSIS — Z1239 Encounter for other screening for malignant neoplasm of breast: Secondary | ICD-10-CM | POA: Diagnosis not present

## 2022-07-30 DIAGNOSIS — Z0000 Encounter for general adult medical examination without abnormal findings: Secondary | ICD-10-CM | POA: Diagnosis not present

## 2022-08-13 DIAGNOSIS — R7401 Elevation of levels of liver transaminase levels: Secondary | ICD-10-CM | POA: Diagnosis not present

## 2022-08-13 DIAGNOSIS — B977 Papillomavirus as the cause of diseases classified elsewhere: Secondary | ICD-10-CM | POA: Diagnosis not present

## 2022-09-13 ENCOUNTER — Ambulatory Visit: Payer: BC Managed Care – PPO | Admitting: Obstetrics & Gynecology

## 2022-09-14 ENCOUNTER — Telehealth: Payer: Self-pay

## 2022-09-14 NOTE — Telephone Encounter (Signed)
Tired calling pt to reschedule no answer. I also sent a fax to her primary that she no showed her appt. ep

## 2023-05-11 DIAGNOSIS — N939 Abnormal uterine and vaginal bleeding, unspecified: Secondary | ICD-10-CM | POA: Diagnosis not present

## 2023-05-11 DIAGNOSIS — N941 Unspecified dyspareunia: Secondary | ICD-10-CM | POA: Diagnosis not present

## 2023-05-15 DIAGNOSIS — R1031 Right lower quadrant pain: Secondary | ICD-10-CM | POA: Diagnosis not present

## 2023-07-14 DIAGNOSIS — Z124 Encounter for screening for malignant neoplasm of cervix: Secondary | ICD-10-CM | POA: Diagnosis not present

## 2023-07-14 DIAGNOSIS — Z113 Encounter for screening for infections with a predominantly sexual mode of transmission: Secondary | ICD-10-CM | POA: Diagnosis not present

## 2023-07-14 DIAGNOSIS — Z01411 Encounter for gynecological examination (general) (routine) with abnormal findings: Secondary | ICD-10-CM | POA: Diagnosis not present

## 2023-07-14 DIAGNOSIS — E282 Polycystic ovarian syndrome: Secondary | ICD-10-CM | POA: Diagnosis not present

## 2023-07-14 DIAGNOSIS — R102 Pelvic and perineal pain: Secondary | ICD-10-CM | POA: Diagnosis not present

## 2023-07-19 LAB — HM PAP SMEAR

## 2023-08-04 ENCOUNTER — Ambulatory Visit: Payer: BC Managed Care – PPO | Admitting: Physician Assistant

## 2023-08-29 ENCOUNTER — Encounter: Payer: Self-pay | Admitting: Physician Assistant

## 2023-08-29 ENCOUNTER — Ambulatory Visit (INDEPENDENT_AMBULATORY_CARE_PROVIDER_SITE_OTHER): Payer: BC Managed Care – PPO | Admitting: Physician Assistant

## 2023-08-29 VITALS — BP 112/80 | HR 84 | Temp 98.3°F | Ht 61.42 in | Wt 139.4 lb

## 2023-08-29 DIAGNOSIS — R21 Rash and other nonspecific skin eruption: Secondary | ICD-10-CM

## 2023-08-29 DIAGNOSIS — K5909 Other constipation: Secondary | ICD-10-CM

## 2023-08-29 DIAGNOSIS — E282 Polycystic ovarian syndrome: Secondary | ICD-10-CM

## 2023-08-29 DIAGNOSIS — M7989 Other specified soft tissue disorders: Secondary | ICD-10-CM | POA: Diagnosis not present

## 2023-08-29 DIAGNOSIS — Z23 Encounter for immunization: Secondary | ICD-10-CM

## 2023-08-29 MED ORDER — TRIAMCINOLONE ACETONIDE 0.5 % EX OINT: 1.0000 | TOPICAL_OINTMENT | Freq: Two times a day (BID) | CUTANEOUS | 0 refills | Status: AC

## 2023-08-29 NOTE — Progress Notes (Signed)
 Patient ID: Angie Jackson, female    DOB: 11/07/2000, 22 y.o.   MRN: [id number]   Assessment & Plan:  PCOS (polycystic ovarian syndrome)  Right leg swelling  Rash -     Triamcinolone  Acetonide; Apply 1 Application topically 2 (two) times daily for 14 days.  Dispense: 30 g; Refill: 0  Chronic constipation  Immunization due -     Tdap vaccine greater than or equal to 7yo IM    Assessment and Plan    Polycystic Ovary Syndrome (PCOS) History of irregular periods and ultrasound suggestive of PCOS. Previous intolerance to birth control pills and Depo shot. Recent trial of Metformin resulted in adverse effects including headaches, nausea, and diarrhea. -Consider consultation with a registered dietitian specializing in PCOS and insulin resistance. -Consider starting a gradual increase in dietary fiber intake, such as fiber gummies.  Unilateral Lower Extremity Swelling Chronic right lower extremity swelling, worse with standing and sitting. Previous Doppler ultrasound was negative for deep vein thrombosis. No associated numbness or tingling. -Continue monitoring. No further imaging or specialist consultation indicated at this time.  Chronic Constipation Longstanding history of constipation with associated bloating and infrequent bowel movements. -Gradually increase dietary fiber intake & exercise.  Rash on Chest New rash on chest, no known cause identified. -Prescribe a short course of topical steroid cream.  General Health Maintenance -Schedule follow-up appointment in summer 2025 to reassess symptoms and response to interventions.         Return in about 6 months (around 02/26/2024) for physical.    Subjective:    Chief Complaint  Patient presents with   New Patient (Initial Visit)    New Pt in office to establish care; pt has concerns with issues where right leg is swelling RLE and at times also in right hand only effecting right side; Went to OBGYN due to cyst in  ovary done US  on leg to check for DVT and all was normal; Has been told she is prediabetic through Dr Delana office 2 months ago; pt has spells of dizzness, fatigue at times;  pt has family hx of diabetes; pt has rash between breast/chest area; no hx of skin issues other than itchy scalp and dandruff. Pt fasting since 8am    HPI Patient is in today for new patient establishment.  GYN: Dr. Delana   Discussed the use of AI scribe software for clinical note transcription with the patient, who gave verbal consent to proceed.  History of Present Illness   Miss Purnell, a young adult with a history of PCOS, presents with chronic right leg swelling. The swelling is worse when she is standing and sometimes occurs when she is sitting. The entire leg, from the groin down, is affected. The swelling started in July 2023 and has remained the same since then. An ultrasound done at that time did not reveal any blood clots. The patient also reports chronic constipation and a recent rash on her chest.  In addition to the leg swelling, the patient has a history of PCOS, diagnosed through ultrasound showing multiple follicles. She has irregular periods and does not menstruate regularly. She has tried various birth control methods and metformin to manage her PCOS symptoms, but they were not well-tolerated due to side effects such as heavy bleeding, skin breakouts, headaches, nausea, and diarrhea. She is currently not on any medication.  The patient also reports a family history of fertility issues, with an aunt who is unable to conceive. She is not currently  trying to conceive but expresses concern about potential future fertility issues.        Past Medical History:  Diagnosis Date   Anxiety    Chronic constipation    Edema    Prediabetes     Past Surgical History:  Procedure Laterality Date   WISDOM TOOTH EXTRACTION Bilateral 2018    Family History  Problem Relation Age of Onset   Diabetes Mother     High Cholesterol Mother    Healthy Father    Healthy Sister    Healthy Sister    Diabetes Paternal Grandmother    Polycystic ovary syndrome Maternal Aunt     Social History   Tobacco Use   Smoking status: Never   Smokeless tobacco: Never  Vaping Use   Vaping status: Never Used  Substance Use Topics   Alcohol use: Never   Drug use: Never     Allergies  Allergen Reactions   Metformin And Related Diarrhea and Nausea And Vomiting    Review of Systems NEGATIVE UNLESS OTHERWISE INDICATED IN HPI      Objective:     BP 112/80 (BP Location: Left Arm, Patient Position: Sitting, Cuff Size: Normal)   Pulse 84   Temp 98.3 F (36.8 C) (Temporal)   Ht 5' 1.42 (1.56 m)   Wt 139 lb 6.4 oz (63.2 kg)   SpO2 97%   BMI 25.98 kg/m   Wt Readings from Last 3 Encounters:  08/29/23 139 lb 6.4 oz (63.2 kg)  04/28/22 122 lb (55.3 kg)  04/12/22 125 lb (56.7 kg)    BP Readings from Last 3 Encounters:  08/29/23 112/80  04/28/22 124/86  04/12/22 109/74     Physical Exam Vitals and nursing note reviewed.  Constitutional:      Appearance: Normal appearance. She is normal weight. She is not toxic-appearing.  HENT:     Head: Normocephalic and atraumatic.     Right Ear: Tympanic membrane, ear canal and external ear normal.     Left Ear: Tympanic membrane, ear canal and external ear normal.     Nose: Nose normal.     Mouth/Throat:     Mouth: Mucous membranes are moist.  Eyes:     Extraocular Movements: Extraocular movements intact.     Conjunctiva/sclera: Conjunctivae normal.     Pupils: Pupils are equal, round, and reactive to light.  Cardiovascular:     Rate and Rhythm: Normal rate and regular rhythm.     Pulses: Normal pulses.     Heart sounds: Normal heart sounds.  Pulmonary:     Effort: Pulmonary effort is normal.     Breath sounds: Normal breath sounds.  Abdominal:     General: Abdomen is flat. Bowel sounds are normal.     Palpations: Abdomen is soft.   Musculoskeletal:        General: No swelling, tenderness or deformity. Normal range of motion.     Cervical back: Normal range of motion and neck supple.     Right lower leg: No edema.     Left lower leg: No edema.  Skin:    General: Skin is warm and dry.     Findings: Rash (mild erythema & dryness between breasts) present. No lesion.  Neurological:     General: No focal deficit present.     Mental Status: She is alert and oriented to person, place, and time.  Psychiatric:        Mood and Affect: Mood normal.  Behavior: Behavior normal.        Thought Content: Thought content normal.        Judgment: Judgment normal.       Rasheema Truluck M Clovis Warwick, PA-C

## 2023-08-29 NOTE — Patient Instructions (Addendum)
 For PCOS nutrition assistance: [email or web address]   VISIT SUMMARY:  Angie Jackson, you visited us  today to discuss several ongoing health concerns, including chronic right leg swelling, PCOS, chronic constipation, and a recent rash on your chest. We reviewed your symptoms and discussed a plan to manage each of these issues.  YOUR PLAN:  -POLYCYSTIC OVARY SYNDROME (PCOS): PCOS is a hormonal disorder causing enlarged ovaries with small cysts on the outer edges. You have a history of irregular periods and have had difficulty tolerating previous treatments. We recommend consulting with a registered dietitian who specializes in PCOS and insulin resistance. Additionally, consider gradually increasing your dietary fiber intake, such as with fiber gummies.  -UNILATERAL LOWER EXTREMITY SWELLING: This refers to swelling in one leg. Your chronic right leg swelling has been monitored, and previous tests did not show any blood clots. We will continue to monitor this condition, and no further imaging or specialist consultation is needed at this time.  -CHRONIC CONSTIPATION: Chronic constipation involves infrequent bowel movements and difficulty passing stool. To help manage this, we suggest gradually increasing your dietary fiber intake.  -RASH ON CHEST: You have a new rash on your chest with no known cause. We will prescribe a short course of topical steroid cream to help reduce the rash.  INSTRUCTIONS:  Please schedule a follow-up appointment in summer 2025 to reassess your symptoms and response to the interventions.

## 2024-01-10 DIAGNOSIS — R102 Pelvic and perineal pain: Secondary | ICD-10-CM | POA: Diagnosis not present

## 2024-01-10 DIAGNOSIS — N760 Acute vaginitis: Secondary | ICD-10-CM | POA: Diagnosis not present

## 2024-01-10 DIAGNOSIS — B3731 Acute candidiasis of vulva and vagina: Secondary | ICD-10-CM | POA: Diagnosis not present

## 2024-01-10 DIAGNOSIS — N912 Amenorrhea, unspecified: Secondary | ICD-10-CM | POA: Diagnosis not present

## 2024-01-10 DIAGNOSIS — R300 Dysuria: Secondary | ICD-10-CM | POA: Diagnosis not present

## 2024-01-10 DIAGNOSIS — Z113 Encounter for screening for infections with a predominantly sexual mode of transmission: Secondary | ICD-10-CM | POA: Diagnosis not present

## 2024-03-31 ENCOUNTER — Encounter (HOSPITAL_COMMUNITY): Payer: Self-pay | Admitting: Pharmacy Technician

## 2024-03-31 ENCOUNTER — Emergency Department (HOSPITAL_COMMUNITY)
Admission: EM | Admit: 2024-03-31 | Discharge: 2024-03-31 | Disposition: A | Discharge status: Home / Self Care | Attending: Student in an Organized Health Care Education/Training Program | Admitting: Student in an Organized Health Care Education/Training Program

## 2024-03-31 ENCOUNTER — Other Ambulatory Visit: Payer: Self-pay

## 2024-03-31 ENCOUNTER — Emergency Department (HOSPITAL_COMMUNITY)

## 2024-03-31 DIAGNOSIS — N132 Hydronephrosis with renal and ureteral calculous obstruction: Secondary | ICD-10-CM | POA: Insufficient documentation

## 2024-03-31 DIAGNOSIS — R109 Unspecified abdominal pain: Secondary | ICD-10-CM

## 2024-03-31 DIAGNOSIS — N200 Calculus of kidney: Secondary | ICD-10-CM

## 2024-03-31 DIAGNOSIS — R1031 Right lower quadrant pain: Secondary | ICD-10-CM | POA: Diagnosis present

## 2024-03-31 HISTORY — DX: Type 2 diabetes mellitus without complications: E11.9

## 2024-03-31 LAB — CBC
HCT: 41.7 % (ref 36.0–46.0)
Hemoglobin: 13.4 g/dL (ref 12.0–15.0)
MCH: 29.3 pg (ref 26.0–34.0)
MCHC: 32.1 g/dL (ref 30.0–36.0)
MCV: 91.2 fL (ref 80.0–100.0)
Platelets: 316 K/uL (ref 150–400)
RBC: 4.57 MIL/uL (ref 3.87–5.11)
RDW: 13.2 % (ref 11.5–15.5)
WBC: 7 K/uL (ref 4.0–10.5)
nRBC: 0 % (ref 0.0–0.2)

## 2024-03-31 LAB — COMPREHENSIVE METABOLIC PANEL WITH GFR
ALT: 18 U/L (ref 0–44)
AST: 46 U/L — ABNORMAL HIGH (ref 15–41)
Albumin: 4 g/dL (ref 3.5–5.0)
Alkaline Phosphatase: 64 U/L (ref 38–126)
Anion gap: 8 (ref 5–15)
BUN: 6 mg/dL (ref 6–20)
CO2: 24 mmol/L (ref 22–32)
Calcium: 9.7 mg/dL (ref 8.9–10.3)
Chloride: 108 mmol/L (ref 98–111)
Creatinine, Ser: 0.8 mg/dL (ref 0.44–1.00)
GFR, Estimated: >60 mL/min (ref >60)
Glucose, Bld: 85 mg/dL (ref 70–99)
Potassium: 4.4 mmol/L (ref 3.5–5.1)
Sodium: 140 mmol/L (ref 135–145)
Total Bilirubin: 0.6 mg/dL (ref 0.0–1.2)
Total Protein: 7.3 g/dL (ref 6.5–8.1)

## 2024-03-31 LAB — LIPASE, BLOOD: Lipase: 35 U/L (ref 11–51)

## 2024-03-31 LAB — URINALYSIS, ROUTINE W REFLEX MICROSCOPIC
Bilirubin Urine: NEGATIVE
Glucose, UA: NEGATIVE mg/dL
Ketones, ur: NEGATIVE mg/dL
Leukocytes,Ua: NEGATIVE
Nitrite: NEGATIVE
Protein, ur: NEGATIVE mg/dL
RBC / HPF: >50 RBC/hpf (ref 0–5)
Specific Gravity, Urine: 1.008 (ref 1.005–1.030)
pH: 8 (ref 5.0–8.0)

## 2024-03-31 LAB — HCG, SERUM, QUALITATIVE: Preg, Serum: NEGATIVE

## 2024-03-31 MED ORDER — KETOROLAC TROMETHAMINE 15 MG/ML IJ SOLN
15.0000 mg | Freq: Once | INTRAMUSCULAR | Status: AC
  Administered 2024-03-31: 15 mg via INTRAVENOUS
  Filled 2024-03-31: qty 1

## 2024-03-31 MED ORDER — IOHEXOL 350 MG/ML SOLN
75.0000 mL | Freq: Once | INTRAVENOUS | Status: AC | PRN
  Administered 2024-03-31: 75 mL via INTRAVENOUS

## 2024-03-31 MED ORDER — HYDROCODONE-ACETAMINOPHEN 5-325 MG PO TABS: 2.0000 | ORAL_TABLET | ORAL | 0 refills | Status: AC | PRN

## 2024-03-31 MED ORDER — ONDANSETRON HCL 4 MG PO TABS: 4.0000 mg | ORAL_TABLET | Freq: Four times a day (QID) | ORAL | 0 refills | Status: AC

## 2024-03-31 MED ORDER — MORPHINE SULFATE (PF) 4 MG/ML IV SOLN
4.0000 mg | Freq: Once | INTRAVENOUS | Status: AC
  Administered 2024-03-31: 4 mg via INTRAVENOUS
  Filled 2024-03-31: qty 1

## 2024-03-31 NOTE — ED Provider Notes (Addendum)
 [place] [facility] AT [facility] Provider Note   CSN: [id number] Arrival date & time: 03/31/24  1520     Patient presents with: Abdominal Pain and Back Pain   Angie Jackson is a 23 y.o. female.   23 year old female presenting to the emergency department for evaluation of her right-sided abdominal pain and back pain x 1 day.  Patient reports she woke up with right lower quadrant abdominal pain that is sharp and radiating to her back.  Her pain has been constant since it began this morning.  She admits to nausea earlier today that is now resolved.  She denies any associated symptoms including urinary symptoms, vaginal discharge, vaginal bleeding, vomiting, diarrhea, or fevers.  She denies any past medical history or any prior surgeries.  She does not take any medications daily.  No history or concern for STIs.   Abdominal Pain Back Pain Associated symptoms: abdominal pain        Prior to Admission medications   Medication Sig Start Date End Date Taking? Authorizing Provider  medroxyPROGESTERone  Acetate 150 MG/ML SUSY Dispense 150mg  to be administered at office 10/09/20 01/15/21  Signa Delon LABOR, NP    Allergies: Metformin and related    Review of Systems  Gastrointestinal:  Positive for abdominal pain.  Musculoskeletal:  Positive for back pain.  All other systems reviewed and are negative.   Updated Vital Signs BP 111/79   Pulse 72   Temp 98.6 F (37 C)   Resp 14   SpO2 100%   Physical Exam Vitals and nursing note reviewed.  Constitutional:      General: She is not in acute distress.    Appearance: She is not toxic-appearing.  HENT:     Head: Normocephalic.     Mouth/Throat:     Mouth: Mucous membranes are moist.  Cardiovascular:     Rate and Rhythm: Normal rate.  Pulmonary:     Effort: Pulmonary effort is normal.  Abdominal:     General: Abdomen is flat. There is no distension.     Palpations: Abdomen is soft.     Tenderness:  There is abdominal tenderness in the right lower quadrant. There is no right CVA tenderness, left CVA tenderness or guarding.  Skin:    General: Skin is warm.  Neurological:     Mental Status: She is alert and oriented to person, place, and time.     (all labs ordered are listed, but only abnormal results are displayed) Labs Reviewed  COMPREHENSIVE METABOLIC PANEL WITH GFR - Abnormal; Notable for the following components:      Result Value   AST 46 (*)    All other components within normal limits  URINALYSIS, ROUTINE W REFLEX MICROSCOPIC - Abnormal; Notable for the following components:   Color, Urine STRAW (*)    Hgb urine dipstick MODERATE (*)    Bacteria, UA RARE (*)    All other components within normal limits  LIPASE, BLOOD  CBC  HCG, SERUM, QUALITATIVE    EKG: None  Radiology: US  PELVIC COMPLETE W TRANSVAGINAL AND TORSION R/O Result Date: 03/31/2024 CLINICAL DATA:  Right lower quadrant abdominal pain, pelvic pain EXAM: TRANSABDOMINAL AND TRANSVAGINAL ULTRASOUND OF PELVIS DOPPLER ULTRASOUND OF OVARIES TECHNIQUE: Both transabdominal and transvaginal ultrasound examinations of the pelvis were performed. Transabdominal technique was performed for global imaging of the pelvis including uterus, ovaries, adnexal regions, and pelvic cul-de-sac. It was necessary to proceed with endovaginal exam following the transabdominal exam to visualize the  endometrium and adnexal structures. Color and duplex Doppler ultrasound was utilized to evaluate blood flow to the ovaries. COMPARISON:  03/31/2024 FINDINGS: Uterus Measurements: 4.2 x 2.2 x 3.4 cm = volume: 17.0 mL. No fibroids or other mass visualized. Endometrium Thickness: 4 mm.  No focal abnormality visualized. Right ovary Measurements: 3.4 x 3.3 x 2.2 cm = volume: 12.7 mL. Normal appearance/no adnexal mass. Left ovary Measurements: 3.5 x 3.3 x 2.5 cm = volume: 14.8 mL. Normal appearance/no adnexal mass. Pulsed Doppler evaluation of both ovaries  demonstrates normal low-resistance arterial and venous waveforms. Other findings Trace pelvic free fluid adjacent to the left adnexa, likely physiologic. IMPRESSION: 1. Age-appropriate pelvic ultrasound. No acute findings. Please see preceding CT report describing an obstructing right UVJ calculus. Electronically Signed   By: Ozell Daring M.D.   On: 03/31/2024 21:05   CT ABDOMEN PELVIS W CONTRAST Result Date: 03/31/2024 CLINICAL DATA:  Right lower quadrant pain radiating to back EXAM: CT ABDOMEN AND PELVIS WITH CONTRAST TECHNIQUE: Multidetector CT imaging of the abdomen and pelvis was performed using the standard protocol following bolus administration of intravenous contrast. RADIATION DOSE REDUCTION: This exam was performed according to the departmental dose-optimization program which includes automated exposure control, adjustment of the mA and/or kV according to patient size and/or use of iterative reconstruction technique. CONTRAST:  75mL OMNIPAQUE  IOHEXOL  350 MG/ML SOLN COMPARISON:  01/15/2021 FINDINGS: Lower chest: No acute pleural or parenchymal lung disease. Hepatobiliary: No focal liver abnormality is seen. No gallstones, gallbladder wall thickening, or biliary dilatation. Pancreas: Unremarkable. No pancreatic ductal dilatation or surrounding inflammatory changes. Spleen: Normal in size without focal abnormality. Adrenals/Urinary Tract: There is an obstructing 3 mm right UVJ calculus reference image 68/3, with mild right-sided hydronephrosis and hydroureter. Left kidney is unremarkable. The adrenals and bladder are unremarkable. Stomach/Bowel: No bowel obstruction or ileus. Normal appendix right lower quadrant. No bowel wall thickening or inflammatory change. Vascular/Lymphatic: No significant vascular findings are present. No enlarged abdominal or pelvic lymph nodes. Reproductive: Uterus and bilateral adnexa are unremarkable. Other: Trace pelvic free fluid, likely physiologic. No free intraperitoneal  gas. No abdominal wall hernia. Musculoskeletal: No acute or destructive bony abnormalities. Reconstructed images demonstrate no additional findings. IMPRESSION: 1. Obstructing 3 mm right UVJ calculus, with mild right-sided hydronephrosis and hydroureter. 2. Normal appendix. Electronically Signed   By: Ozell Daring M.D.   On: 03/31/2024 20:41     Procedures   Medications Ordered in the ED  morphine  (PF) 4 MG/ML injection 4 mg (has no administration in time range)  ketorolac  (TORADOL ) 15 MG/ML injection 15 mg (15 mg Intravenous Given 03/31/24 1859)  iohexol  (OMNIPAQUE ) 350 MG/ML injection 75 mL (75 mLs Intravenous Contrast Given 03/31/24 2024)                                    Medical Decision Making 23 year old female presenting to the emergency department for evaluation of her right lower quadrant abdominal pain.  Patient's vitals are stable and she is overall well-appearing.  She did have mild tenderness to palpation in the right lower quadrant on exam.  Differential for her symptoms include but are not limited to acute appendicitis, UTI, ovarian cyst, ovarian torsion, kidney stone, and others.  Basic blood work obtained in triage does not show any acute abnormalities.  Patient white count was within normal limits and CMP overall unremarkable.  Urinalysis does not show evidence of a UTI and urine pregnancy  was negative.  Toradol  provided for pain control.  CT scan of the abdomen/pelvis shows a 3 mm obstructive right side kidney stone.  Normal appendix and normal pelvic ultrasound.  We will plan to prescribe the patient outpatient pain meds and have her follow-up with urology.  The kidney stone should pass on its own at this size.  Discussed strict return precautions.  Amount and/or Complexity of Data Reviewed Labs: ordered. Radiology: ordered.  Risk Prescription drug management.    Final diagnoses:  Abdominal pain, unspecified abdominal location  Kidney stone    ED Discharge  Orders     None          Rachel Samples, DO 03/31/24 2109

## 2024-03-31 NOTE — Discharge Instructions (Addendum)
 Your workup today showed a 3 mm kidney stone.  We have sent a referral to urology.  Please follow-up with urology within the next week.  Take the pain medication and nausea medication as needed.  Return if you have any worsening pain or development of any new symptoms.

## 2024-03-31 NOTE — ED Triage Notes (Signed)
 Pt here with r sided abdominal/flank/back pain onset today. Endorses nausea, denies urinary symptoms.
